# Patient Record
Sex: Male | Born: 1995 | Hispanic: Yes | Marital: Single | Smoking: Never smoker
Health system: Southern US, Community
[De-identification: ages and names within clinical notes are randomized; demographics above are authoritative.]

## PROBLEM LIST (undated history)

## (undated) HISTORY — PX: TONSILLECTOMY: SUR1361

---
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
## 2017-02-05 ENCOUNTER — Encounter: Payer: Self-pay | Admitting: Emergency Medicine

## 2017-02-05 DIAGNOSIS — L559 Sunburn, unspecified: Secondary | ICD-10-CM | POA: Diagnosis not present

## 2017-02-05 DIAGNOSIS — R21 Rash and other nonspecific skin eruption: Secondary | ICD-10-CM | POA: Diagnosis not present

## 2017-02-05 DIAGNOSIS — Z5321 Procedure and treatment not carried out due to patient leaving prior to being seen by health care provider: Secondary | ICD-10-CM | POA: Diagnosis not present

## 2017-02-05 NOTE — ED Triage Notes (Signed)
Patient to ER for c/o itchy rash to torso and arms. Patient with mild sunburn, states "I think it's from the sunburn".

## 2017-02-27 ENCOUNTER — Emergency Department
Admission: EM | Admit: 2017-02-27 | Discharge: 2017-02-27 | Disposition: A | Discharge status: Home / Self Care | Payer: Medicaid Other | Attending: Emergency Medicine | Admitting: Emergency Medicine
